# Patient Record
Sex: Male | Born: 1975 | Hispanic: No | Marital: Married | Smoking: Former smoker
Health system: Southern US, Community
[De-identification: ages and names within clinical notes are randomized; demographics above are authoritative.]

## PROBLEM LIST (undated history)

## (undated) DIAGNOSIS — I10 Essential (primary) hypertension: Secondary | ICD-10-CM

## (undated) DIAGNOSIS — E785 Hyperlipidemia, unspecified: Secondary | ICD-10-CM

## (undated) DIAGNOSIS — E119 Type 2 diabetes mellitus without complications: Secondary | ICD-10-CM

## (undated) DIAGNOSIS — N289 Disorder of kidney and ureter, unspecified: Secondary | ICD-10-CM

---

## 2005-07-23 ENCOUNTER — Ambulatory Visit (HOSPITAL_BASED_OUTPATIENT_CLINIC_OR_DEPARTMENT_OTHER): Admission: RE | Admit: 2005-07-23 | Discharge: 2005-07-23 | Payer: Self-pay | Admitting: Urology

## 2015-04-15 ENCOUNTER — Emergency Department (HOSPITAL_COMMUNITY)
Admission: EM | Admit: 2015-04-15 | Discharge: 2015-04-15 | Disposition: A | Discharge status: Home / Self Care | Payer: BLUE CROSS/BLUE SHIELD

## 2015-06-25 DIAGNOSIS — R7309 Other abnormal glucose: Secondary | ICD-10-CM

## 2015-06-25 DIAGNOSIS — E1122 Type 2 diabetes mellitus with diabetic chronic kidney disease: Secondary | ICD-10-CM

## 2015-06-25 DIAGNOSIS — R7303 Prediabetes: Secondary | ICD-10-CM

## 2015-06-25 DIAGNOSIS — R9431 Abnormal electrocardiogram [ECG] [EKG]: Secondary | ICD-10-CM

## 2015-06-25 DIAGNOSIS — R5381 Other malaise: Secondary | ICD-10-CM

## 2015-06-25 DIAGNOSIS — I10 Essential (primary) hypertension: Secondary | ICD-10-CM

## 2015-06-25 DIAGNOSIS — N1831 Chronic kidney disease, stage 3a: Secondary | ICD-10-CM

## 2015-06-25 DIAGNOSIS — E782 Mixed hyperlipidemia: Secondary | ICD-10-CM

## 2015-06-25 HISTORY — DX: Essential (primary) hypertension: I10

## 2015-06-25 HISTORY — DX: Prediabetes: R73.03

## 2015-06-25 HISTORY — DX: Chronic kidney disease, stage 3a: N18.31

## 2015-06-25 HISTORY — DX: Type 2 diabetes mellitus with diabetic chronic kidney disease: E11.22

## 2015-06-25 HISTORY — DX: Other abnormal glucose: R73.09

## 2015-06-25 HISTORY — DX: Other malaise: R53.81

## 2015-06-25 HISTORY — DX: Abnormal electrocardiogram (ECG) (EKG): R94.31

## 2015-06-25 HISTORY — DX: Mixed hyperlipidemia: E78.2

## 2016-01-19 ENCOUNTER — Other Ambulatory Visit (HOSPITAL_COMMUNITY): Payer: Self-pay | Admitting: Physician Assistant

## 2016-01-19 DIAGNOSIS — R1310 Dysphagia, unspecified: Secondary | ICD-10-CM

## 2016-01-19 HISTORY — DX: Dysphagia, unspecified: R13.10

## 2016-01-23 ENCOUNTER — Ambulatory Visit (HOSPITAL_COMMUNITY): Payer: BLUE CROSS/BLUE SHIELD

## 2016-01-27 ENCOUNTER — Ambulatory Visit (HOSPITAL_COMMUNITY): Payer: BLUE CROSS/BLUE SHIELD

## 2016-02-06 ENCOUNTER — Ambulatory Visit (HOSPITAL_COMMUNITY)
Admission: RE | Admit: 2016-02-06 | Discharge: 2016-02-06 | Disposition: A | Discharge status: Home / Self Care | Payer: BLUE CROSS/BLUE SHIELD | Source: Ambulatory Visit | Attending: Physician Assistant | Admitting: Physician Assistant

## 2016-02-06 DIAGNOSIS — K222 Esophageal obstruction: Secondary | ICD-10-CM | POA: Diagnosis not present

## 2016-02-06 DIAGNOSIS — R1310 Dysphagia, unspecified: Secondary | ICD-10-CM | POA: Insufficient documentation

## 2016-02-06 DIAGNOSIS — K449 Diaphragmatic hernia without obstruction or gangrene: Secondary | ICD-10-CM | POA: Diagnosis not present

## 2016-04-23 DIAGNOSIS — D751 Secondary polycythemia: Secondary | ICD-10-CM

## 2016-04-23 DIAGNOSIS — N183 Chronic kidney disease, stage 3 unspecified: Secondary | ICD-10-CM

## 2016-04-23 HISTORY — DX: Secondary polycythemia: D75.1

## 2016-04-23 HISTORY — DX: Chronic kidney disease, stage 3 unspecified: N18.30

## 2016-09-22 DIAGNOSIS — G4730 Sleep apnea, unspecified: Secondary | ICD-10-CM

## 2016-09-22 HISTORY — DX: Sleep apnea, unspecified: G47.30

## 2019-02-12 DIAGNOSIS — K219 Gastro-esophageal reflux disease without esophagitis: Secondary | ICD-10-CM

## 2019-02-12 HISTORY — DX: Gastro-esophageal reflux disease without esophagitis: K21.9

## 2019-07-05 ENCOUNTER — Ambulatory Visit (INDEPENDENT_AMBULATORY_CARE_PROVIDER_SITE_OTHER): Payer: BC Managed Care – PPO

## 2019-07-05 ENCOUNTER — Encounter: Payer: Self-pay | Admitting: Sports Medicine

## 2019-07-05 ENCOUNTER — Ambulatory Visit: Payer: BC Managed Care – PPO | Admitting: Sports Medicine

## 2019-07-05 ENCOUNTER — Other Ambulatory Visit: Payer: Self-pay

## 2019-07-05 ENCOUNTER — Other Ambulatory Visit: Payer: Self-pay | Admitting: Sports Medicine

## 2019-07-05 DIAGNOSIS — M79672 Pain in left foot: Secondary | ICD-10-CM

## 2019-07-05 DIAGNOSIS — M722 Plantar fascial fibromatosis: Secondary | ICD-10-CM

## 2019-07-05 DIAGNOSIS — M216X2 Other acquired deformities of left foot: Secondary | ICD-10-CM

## 2019-07-05 DIAGNOSIS — M79673 Pain in unspecified foot: Secondary | ICD-10-CM

## 2019-07-05 MED ORDER — MELOXICAM 15 MG PO TABS: 15.0000 mg | ORAL_TABLET | Freq: Every day | ORAL | 0 refills | Status: DC

## 2019-07-05 MED ORDER — PREDNISONE 10 MG (21) PO TBPK: ORAL_TABLET | ORAL | 0 refills | Status: DC
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
## 2019-07-05 NOTE — Patient Instructions (Signed)

## 2019-07-25 ENCOUNTER — Ambulatory Visit: Payer: BC Managed Care – PPO | Admitting: Sports Medicine

## 2019-09-04 DIAGNOSIS — R1084 Generalized abdominal pain: Secondary | ICD-10-CM

## 2019-09-04 DIAGNOSIS — F17200 Nicotine dependence, unspecified, uncomplicated: Secondary | ICD-10-CM

## 2019-09-04 HISTORY — DX: Generalized abdominal pain: R10.84

## 2019-09-04 HISTORY — DX: Nicotine dependence, unspecified, uncomplicated: F17.200

## 2019-09-06 DIAGNOSIS — E0590 Thyrotoxicosis, unspecified without thyrotoxic crisis or storm: Secondary | ICD-10-CM

## 2019-09-06 HISTORY — DX: Thyrotoxicosis, unspecified without thyrotoxic crisis or storm: E05.90

## 2019-09-11 ENCOUNTER — Other Ambulatory Visit: Payer: Self-pay | Admitting: Sports Medicine

## 2019-09-11 DIAGNOSIS — M722 Plantar fascial fibromatosis: Secondary | ICD-10-CM

## 2020-06-24 ENCOUNTER — Emergency Department (HOSPITAL_BASED_OUTPATIENT_CLINIC_OR_DEPARTMENT_OTHER)
Admission: EM | Admit: 2020-06-24 | Discharge: 2020-06-24 | Disposition: A | Discharge status: Home / Self Care | Payer: BC Managed Care – PPO | Attending: Emergency Medicine | Admitting: Emergency Medicine

## 2020-06-24 ENCOUNTER — Encounter (HOSPITAL_BASED_OUTPATIENT_CLINIC_OR_DEPARTMENT_OTHER): Payer: Self-pay | Admitting: Emergency Medicine

## 2020-06-24 ENCOUNTER — Other Ambulatory Visit: Payer: Self-pay

## 2020-06-24 DIAGNOSIS — Z79899 Other long term (current) drug therapy: Secondary | ICD-10-CM | POA: Diagnosis not present

## 2020-06-24 DIAGNOSIS — R55 Syncope and collapse: Secondary | ICD-10-CM | POA: Diagnosis present

## 2020-06-24 DIAGNOSIS — R112 Nausea with vomiting, unspecified: Secondary | ICD-10-CM | POA: Insufficient documentation

## 2020-06-24 DIAGNOSIS — R61 Generalized hyperhidrosis: Secondary | ICD-10-CM | POA: Insufficient documentation

## 2020-06-24 DIAGNOSIS — N183 Chronic kidney disease, stage 3 unspecified: Secondary | ICD-10-CM | POA: Diagnosis not present

## 2020-06-24 DIAGNOSIS — I129 Hypertensive chronic kidney disease with stage 1 through stage 4 chronic kidney disease, or unspecified chronic kidney disease: Secondary | ICD-10-CM | POA: Insufficient documentation

## 2020-06-24 DIAGNOSIS — D72829 Elevated white blood cell count, unspecified: Secondary | ICD-10-CM | POA: Diagnosis not present

## 2020-06-24 DIAGNOSIS — Z87891 Personal history of nicotine dependence: Secondary | ICD-10-CM | POA: Insufficient documentation

## 2020-06-24 DIAGNOSIS — E1122 Type 2 diabetes mellitus with diabetic chronic kidney disease: Secondary | ICD-10-CM | POA: Insufficient documentation

## 2020-06-24 HISTORY — DX: Essential (primary) hypertension: I10

## 2020-06-24 HISTORY — DX: Disorder of kidney and ureter, unspecified: N28.9

## 2020-06-24 HISTORY — DX: Hyperlipidemia, unspecified: E78.5

## 2020-06-24 HISTORY — DX: Type 2 diabetes mellitus without complications: E11.9

## 2020-06-24 LAB — COMPREHENSIVE METABOLIC PANEL
ALT: 43 U/L (ref 0–44)
AST: 22 U/L (ref 15–41)
Albumin: 4.3 g/dL (ref 3.5–5.0)
Alkaline Phosphatase: 45 U/L (ref 38–126)
Anion gap: 8 (ref 5–15)
BUN: 18 mg/dL (ref 6–20)
CO2: 27 mmol/L (ref 22–32)
Calcium: 9.5 mg/dL (ref 8.9–10.3)
Chloride: 100 mmol/L (ref 98–111)
Creatinine, Ser: 1.51 mg/dL — ABNORMAL HIGH (ref 0.61–1.24)
GFR, Estimated: 59 mL/min — ABNORMAL LOW (ref >60)
Glucose, Bld: 171 mg/dL — ABNORMAL HIGH (ref 70–99)
Potassium: 4.8 mmol/L (ref 3.5–5.1)
Sodium: 135 mmol/L (ref 135–145)
Total Bilirubin: 0.5 mg/dL (ref 0.3–1.2)
Total Protein: 7.1 g/dL (ref 6.5–8.1)

## 2020-06-24 LAB — LIPASE, BLOOD: Lipase: 35 U/L (ref 11–51)

## 2020-06-24 LAB — CBC
HCT: 47.1 % (ref 39.0–52.0)
Hemoglobin: 16.1 g/dL (ref 13.0–17.0)
MCH: 29 pg (ref 26.0–34.0)
MCHC: 34.2 g/dL (ref 30.0–36.0)
MCV: 84.7 fL (ref 80.0–100.0)
Platelets: 217 10*3/uL (ref 150–400)
RBC: 5.56 MIL/uL (ref 4.22–5.81)
RDW: 13.2 % (ref 11.5–15.5)
WBC: 11.5 10*3/uL — ABNORMAL HIGH (ref 4.0–10.5)
nRBC: 0 % (ref 0.0–0.2)

## 2020-06-24 LAB — CBG MONITORING, ED: Glucose-Capillary: 163 mg/dL — ABNORMAL HIGH (ref 70–99)

## 2020-06-24 MED ORDER — PROMETHAZINE HCL 25 MG/ML IJ SOLN
INTRAMUSCULAR | Status: AC
  Filled 2020-06-24: qty 1

## 2020-06-24 MED ORDER — ONDANSETRON 4 MG PO TBDP: 4.0000 mg | ORAL_TABLET | Freq: Three times a day (TID) | ORAL | 1 refills | Status: DC | PRN

## 2020-06-24 MED ORDER — SODIUM CHLORIDE 0.9 % IV BOLUS
1000.0000 mL | Freq: Once | INTRAVENOUS | Status: AC
  Administered 2020-06-24: 1000 mL via INTRAVENOUS

## 2020-06-24 MED ORDER — DEXTROSE-NACL 5-0.9 % IV SOLN: INTRAVENOUS | Status: DC

## 2020-06-24 MED ORDER — SODIUM CHLORIDE 0.9 % IV SOLN
12.5000 mg | Freq: Four times a day (QID) | INTRAVENOUS | Status: DC | PRN
  Administered 2020-06-24: 12.5 mg via INTRAVENOUS
  Filled 2020-06-24: qty 0.5

## 2020-06-24 MED ORDER — METOCLOPRAMIDE HCL 5 MG/ML IJ SOLN
10.0000 mg | Freq: Once | INTRAMUSCULAR | Status: AC
  Administered 2020-06-24: 10 mg via INTRAVENOUS
  Filled 2020-06-24: qty 2

## 2020-06-24 NOTE — ED Notes (Signed)
Pt. ambulated per MD request, pt. tolerated well, RN/MD made aware.

## 2020-06-24 NOTE — Discharge Instructions (Addendum)
Work note provided to be out of work for the next 2 days.  Take Zofran as needed.  Return for any new or worse symptoms to include passing out.  If diarrhea starts she can take Imodium A-D over-the-counter.  If this does start it would be more suggestive of viral stomach bug.

## 2020-06-24 NOTE — ED Triage Notes (Signed)
Patient reports to the ER via EMS: Patient reportedly was getting his hair cut and had a syncopal event. Patient received en route as well as 4mg  of zofran. Patient reports nausea and Emesis. Patient had positive orthostatic changes prior to fluid   SPO2 97% CBG 189 BP 126/65

## 2020-06-24 NOTE — ED Provider Notes (Addendum)
[facility] EMERGENCY DEPT Provider Note   CSN: [id number] Arrival date & time: 06/24/20  1408     History Chief Complaint  Patient presents with  . Loss of Consciousness  . Nausea  . Emesis    Kristopher Ramsey is a 45 y.o. male.  Patient brought in by EMS.  Patient was getting his haircut.  While he was sitting in the chair he started to get diaphoretic feel nauseated.  Patient then passed out.  When he woke up he vomited.  EMS gave him a liter of fluid and 4 mg of Zofran in route.  Patient states he felt fine earlier today.  Patient denies any chest pain or any shortness of breath.  EMS reported positive orthostatic changes prior to the fluid.  Blood sugar was 189.  Blood pressure reported at 126/65.  And oxygen saturation was 97%.  Patient denies any diarrhea.  Denies any sick exposures.  Also no abdominal pain.  Past medical history significant for hyperlipidemia and hypertension.  Questionable chronic kidney disease reported in the past.        Past Medical History:  Diagnosis Date  . Diabetes mellitus without complication (HCC)    prediabetic  . Hyperlipidemia   . Hypertension   . Renal disorder     Patient Active Problem List   Diagnosis Date Noted  . GERD without esophagitis 02/12/2019  . Sleep apnea 09/22/2016  . CKD (chronic kidney disease) stage 3, GFR 30-59 ml/min (HCC) 04/23/2016  . Polycythemia 04/23/2016  . Dysphagia 01/19/2016  . Abnormal blood sugar 06/25/2015  . Abnormal EKG 06/25/2015  . Essential hypertension 06/25/2015  . Malaise and fatigue 06/25/2015  . Mixed hyperlipidemia 06/25/2015  . Prediabetes 06/25/2015    Past Surgical History:  Procedure Laterality Date  . APPENDECTOMY    . HERNIA REPAIR         History reviewed. No pertinent family history.  Social History   Tobacco Use  . Smoking status: Former Games developer  . Smokeless tobacco: Never Used  Vaping Use  . Vaping Use: Every day  Substance Use Topics  . Alcohol use:  Yes    Comment: very rarely  . Drug use: Not Currently    Home Medications Prior to Admission medications   Medication Sig Start Date End Date Taking? Authorizing Provider  amLODipine (NORVASC) 5 MG tablet Take 5 mg by mouth daily. 06/24/19   [provider]  losartan-hydrochlorothiazide (HYZAAR) 50-12.5 MG tablet Take 1 tablet by mouth daily. 04/27/19   [provider]  meloxicam (MOBIC) 15 MG tablet Take 1 tablet (15 mg total) by mouth daily. 07/05/19   Asencion Islam, DPM  pravastatin (PRAVACHOL) 20 MG tablet Take 20 mg by mouth daily. 06/06/19   [provider]  predniSONE (STERAPRED UNI-PAK 21 TAB) 10 MG (21) TBPK tablet Take as directed 07/05/19   Asencion Islam, DPM    Allergies    Codeine  Review of Systems   Review of Systems  Constitutional: Negative for chills and fever.  HENT: Negative for congestion, rhinorrhea and sore throat.   Eyes: Negative for visual disturbance.  Respiratory: Negative for cough and shortness of breath.   Cardiovascular: Negative for chest pain and leg swelling.  Gastrointestinal: Positive for nausea and vomiting. Negative for abdominal pain and diarrhea.  Genitourinary: Negative for dysuria.  Musculoskeletal: Negative for back pain and neck pain.  Skin: Negative for rash.  Neurological: Positive for syncope. Negative for dizziness, light-headedness and headaches.  Hematological: Does not bruise/bleed easily.  Psychiatric/Behavioral: Negative for confusion.    Physical Exam Updated Vital Signs BP 120/69 (BP Location: Right Arm)   Pulse (!) 56   Temp 97.8 F (36.6 C) (Oral)   Resp 10   Ht 1.829 m (6')   Wt 108.9 kg   SpO2 99%   BMI 32.55 kg/m   Physical Exam Vitals and nursing note reviewed.  Constitutional:      Appearance: He is well-developed. He is ill-appearing.  HENT:     Head: Normocephalic and atraumatic.  Eyes:     Extraocular Movements: Extraocular movements intact.     Conjunctiva/sclera:  Conjunctivae normal.     Pupils: Pupils are equal, round, and reactive to light.  Cardiovascular:     Rate and Rhythm: Normal rate and regular rhythm.     Heart sounds: No murmur heard.   Pulmonary:     Effort: Pulmonary effort is normal. No respiratory distress.     Breath sounds: Normal breath sounds.  Abdominal:     Palpations: Abdomen is soft.     Tenderness: There is no abdominal tenderness.  Musculoskeletal:        General: Normal range of motion.     Cervical back: Neck supple. No rigidity.  Skin:    General: Skin is warm and dry.     Capillary Refill: Capillary refill takes less than 2 seconds.  Neurological:     General: No focal deficit present.     Mental Status: He is alert and oriented to person, place, and time.     Cranial Nerves: No cranial nerve deficit.     Sensory: No sensory deficit.     ED Results / Procedures / Treatments   Labs (all labs ordered are listed, but only abnormal results are displayed) Labs Reviewed  COMPREHENSIVE METABOLIC PANEL - Abnormal; Notable for the following components:      Result Value   Glucose, Bld 171 (*)    Creatinine, Ser 1.51 (*)    GFR, Estimated 59 (*)    All other components within normal limits  CBC - Abnormal; Notable for the following components:   WBC 11.5 (*)    All other components within normal limits  CBG MONITORING, ED - Abnormal; Notable for the following components:   Glucose-Capillary 163 (*)    All other components within normal limits  LIPASE, BLOOD  URINALYSIS, ROUTINE W REFLEX MICROSCOPIC    EKG EKG Interpretation  Date/Time:  Tuesday Jun 24 2020 14:16:07 EDT Ventricular Rate:  63 PR Interval:  134 QRS Duration: 108 QT Interval:  419 QTC Calculation: 429 R Axis:   56 Text Interpretation: Sinus rhythm Incomplete left bundle branch block ST elev, probable normal early repol pattern No previous ECGs available Confirmed by Vanetta Mulders [id number]) on 06/24/2020 2:34:36 PM   Radiology No  results found.  Procedures Procedures  Medications Ordered in ED Medications  sodium chloride 0.9 % bolus 1,000 mL (has no administration in time range)  dextrose 5 %-0.9 % sodium chloride infusion (has no administration in time range)  promethazine (PHENERGAN) 12.5 mg in sodium chloride 0.9 % 50 mL IVPB (has no administration in time range)    ED Course  I have reviewed the triage vital signs and the nursing notes.  Pertinent labs & imaging results that were available during my care of the patient were reviewed by me and considered in my medical decision making (see chart for details).    MDM Rules/Calculators/A&P  Patient will receive another liter of fluid.  Then he will be given D5 normal saline.  However his blood sugars are actually a little bit elevated here today 163.  BUN is normal.  Creatinine is 1.51.  For a GFR of 59.  So has some renal insufficiency liver function test are normal.  Lipase is normal.  CBC with a slight leukocytosis at 11.5.  Hemoglobin at 16.1.  Patient could be a little dehydrated.  EKG nothing for comparison but showed incomplete left bundle branch block.  And some ST elevation more consistent with early repolarization.  Patient has had no chest pain.  Cardiac monitoring without any arrhythmias  Will that patient's syncope was probably vasovagal.  Nausea and vomiting patient thinks is related to not being able to eat says this is happened before.  But could also be related to a viral gastritis.  Certainly if he develops any diarrhea be most consistent with a viral gastroenteritis.   Final Clinical Impression(s) / ED Diagnoses Final diagnoses:  Vasovagal syncope  Nausea and vomiting, intractability of vomiting not specified, unspecified vomiting type    Rx / DC Orders ED Discharge Orders    None       Vanetta Mulders, MD 06/24/20 1523    Vanetta Mulders, MD 06/24/20 1524

## 2020-06-24 NOTE — ED Provider Notes (Signed)
Patient feeling well on reevaluation.  He is standing and walking without difficulty.  Nausea has subsided   Gwyneth Sprout, MD 06/24/20 1711

## 2020-07-07 ENCOUNTER — Other Ambulatory Visit: Payer: Self-pay

## 2020-07-07 ENCOUNTER — Emergency Department (HOSPITAL_COMMUNITY): Payer: BC Managed Care – PPO

## 2020-07-07 ENCOUNTER — Emergency Department (HOSPITAL_COMMUNITY)
Admission: EM | Admit: 2020-07-07 | Discharge: 2020-07-08 | Disposition: A | Discharge status: Home / Self Care | Payer: BC Managed Care – PPO | Attending: Emergency Medicine | Admitting: Emergency Medicine

## 2020-07-07 ENCOUNTER — Encounter (HOSPITAL_COMMUNITY): Payer: Self-pay | Admitting: Emergency Medicine

## 2020-07-07 DIAGNOSIS — Z87891 Personal history of nicotine dependence: Secondary | ICD-10-CM | POA: Insufficient documentation

## 2020-07-07 DIAGNOSIS — I129 Hypertensive chronic kidney disease with stage 1 through stage 4 chronic kidney disease, or unspecified chronic kidney disease: Secondary | ICD-10-CM | POA: Insufficient documentation

## 2020-07-07 DIAGNOSIS — R079 Chest pain, unspecified: Secondary | ICD-10-CM | POA: Diagnosis not present

## 2020-07-07 DIAGNOSIS — Z79899 Other long term (current) drug therapy: Secondary | ICD-10-CM | POA: Diagnosis not present

## 2020-07-07 DIAGNOSIS — R55 Syncope and collapse: Secondary | ICD-10-CM | POA: Insufficient documentation

## 2020-07-07 DIAGNOSIS — N183 Chronic kidney disease, stage 3 unspecified: Secondary | ICD-10-CM | POA: Diagnosis not present

## 2020-07-07 DIAGNOSIS — E1122 Type 2 diabetes mellitus with diabetic chronic kidney disease: Secondary | ICD-10-CM | POA: Diagnosis not present

## 2020-07-07 DIAGNOSIS — R42 Dizziness and giddiness: Secondary | ICD-10-CM | POA: Diagnosis present

## 2020-07-07 LAB — CBC
HCT: 49.7 % (ref 39.0–52.0)
Hemoglobin: 17.4 g/dL — ABNORMAL HIGH (ref 13.0–17.0)
MCH: 29.6 pg (ref 26.0–34.0)
MCHC: 35 g/dL (ref 30.0–36.0)
MCV: 84.5 fL (ref 80.0–100.0)
Platelets: 281 10*3/uL (ref 150–400)
RBC: 5.88 MIL/uL — ABNORMAL HIGH (ref 4.22–5.81)
RDW: 13 % (ref 11.5–15.5)
WBC: 12.7 10*3/uL — ABNORMAL HIGH (ref 4.0–10.5)
nRBC: 0 % (ref 0.0–0.2)

## 2020-07-07 NOTE — ED Triage Notes (Signed)
Per ems, pt from home. Pt reports left arm and chest pain, dizziness, and nausea since 4pm. Pt reports tingling radiating down left arm. EMS BP 160/90, HR 70, CBG 152. 96% room air. Hx of htn and high cholesterol. Pt took aspirin prior to ems arrival. Reports pain in left arm and leg has subsided but still reports tingling. No facial droop noted. Pt reports similar episode 2 weeks ago, has not had follow up appointment yet.

## 2020-07-08 LAB — TROPONIN I (HIGH SENSITIVITY)
Troponin I (High Sensitivity): 9 ng/L (ref <18)
Troponin I (High Sensitivity): 8 ng/L (ref <18)

## 2020-07-08 LAB — BASIC METABOLIC PANEL
Anion gap: 8 (ref 5–15)
BUN: 16 mg/dL (ref 6–20)
CO2: 28 mmol/L (ref 22–32)
Calcium: 9.4 mg/dL (ref 8.9–10.3)
Chloride: 100 mmol/L (ref 98–111)
Creatinine, Ser: 1.48 mg/dL — ABNORMAL HIGH (ref 0.61–1.24)
GFR, Estimated: >60 mL/min (ref >60)
Glucose, Bld: 148 mg/dL — ABNORMAL HIGH (ref 70–99)
Potassium: 3.6 mmol/L (ref 3.5–5.1)
Sodium: 136 mmol/L (ref 135–145)

## 2020-07-08 NOTE — Discharge Instructions (Addendum)
I would call cardiology office this morning to try and get follow-up appointment scheduled. Make sure to rest, hydrate, and take it easy today. Can follow-up with your primary care doctor in the interim. Return here for any new/acute changes.

## 2020-07-08 NOTE — ED Provider Notes (Signed)
[facility] EMERGENCY DEPARTMENT Provider Note   CSN: [id number] Arrival date & time: 07/07/20  2258     History Chief Complaint  Patient presents with  . Chest Pain    Kristopher Ramsey is a 45 y.o. male.  The history is provided by the patient and medical records.   45 year old male with history of diabetes, hyperlipidemia, hypertension, chronic kidney disease, presenting to the ED today with chest pain.  2 weeks ago he was seen at sister facility after a syncopal event while getting his haircut.  States today around 3:00pm he was running errands with his wife when he began to feel very lightheaded, somewhat dizzy, nauseated, and like he was going to pass out again.  They pulled over and he sat very still and drank some water and symptoms somewhat improved. He never lost consciousness. He went home and had subsequent episode while sitting on the cough so he laid in the floor then developed chest pain and felt like he was giving himself a "panic attack".  He states pain dissipated over the past few hours but he still feels "off".  He states there is not any room spinning sensation, focal numbness/weakness, confusion, visual change, slurred speech, or trouble walking.  He does report some continued lightheadedness with standing however.  He did eat/drink today as normal.  No new medications or changes to his usual dosing.  No fevers or recent illness.  He denies known cardiac history.  Past Medical History:  Diagnosis Date  . Diabetes mellitus without complication (HCC)    prediabetic  . Hyperlipidemia   . Hypertension   . Renal disorder     Patient Active Problem List   Diagnosis Date Noted  . GERD without esophagitis 02/12/2019  . Sleep apnea 09/22/2016  . CKD (chronic kidney disease) stage 3, GFR 30-59 ml/min (HCC) 04/23/2016  . Polycythemia 04/23/2016  . Dysphagia 01/19/2016  . Abnormal blood sugar 06/25/2015  . Abnormal EKG 06/25/2015  . Essential hypertension  06/25/2015  . Malaise and fatigue 06/25/2015  . Mixed hyperlipidemia 06/25/2015  . Prediabetes 06/25/2015    Past Surgical History:  Procedure Laterality Date  . APPENDECTOMY    . HERNIA REPAIR         No family history on file.  Social History   Tobacco Use  . Smoking status: Former Games developer  . Smokeless tobacco: Never Used  Vaping Use  . Vaping Use: Every day  Substance Use Topics  . Alcohol use: Yes    Comment: very rarely  . Drug use: Not Currently    Home Medications Prior to Admission medications   Medication Sig Start Date End Date Taking? Authorizing Provider  amLODipine (NORVASC) 5 MG tablet Take 5 mg by mouth daily. 06/24/19   [provider]  losartan-hydrochlorothiazide (HYZAAR) 50-12.5 MG tablet Take 1 tablet by mouth daily. 04/27/19   [provider]  meloxicam (MOBIC) 15 MG tablet Take 1 tablet (15 mg total) by mouth daily. 07/05/19   Stover, Titorya, DPM  ondansetron (ZOFRAN ODT) 4 MG disintegrating tablet Take 1 tablet (4 mg total) by mouth every 8 (eight) hours as needed for nausea or vomiting. 06/24/20   Vanetta Mulders, MD  pravastatin (PRAVACHOL) 20 MG tablet Take 20 mg by mouth daily. 06/06/19   [provider]  predniSONE (STERAPRED UNI-PAK 21 TAB) 10 MG (21) TBPK tablet Take as directed 07/05/19   Asencion Islam, DPM    Allergies    Codeine  Review of Systems  Review of Systems  Cardiovascular: Positive for chest pain.  Gastrointestinal: Positive for nausea.  Neurological: Positive for light-headedness.  All other systems reviewed and are negative.   Physical Exam Updated Vital Signs BP 120/80 (BP Location: Right Arm)   Pulse 62   Temp (!) 97.5 F (36.4 C) (Oral)   Resp 16   Ht 6' (1.829 m)   Wt 108.9 kg   SpO2 100%   BMI 32.55 kg/m   Physical Exam Vitals and nursing note reviewed.  Constitutional:      Appearance: He is well-developed.  HENT:     Head: Normocephalic and atraumatic.  Eyes:      Conjunctiva/sclera: Conjunctivae normal.     Pupils: Pupils are equal, round, and reactive to light.  Cardiovascular:     Rate and Rhythm: Normal rate and regular rhythm.     Heart sounds: Normal heart sounds.  Pulmonary:     Effort: Pulmonary effort is normal.     Breath sounds: Normal breath sounds. No wheezing or rhonchi.  Abdominal:     General: Bowel sounds are normal.     Palpations: Abdomen is soft.  Musculoskeletal:        General: Normal range of motion.     Cervical back: Normal range of motion.  Skin:    General: Skin is warm and dry.  Neurological:     Mental Status: He is alert and oriented to person, place, and time.     Comments: AAOx3, answering questions and following commands appropriately; equal strength UE and LE bilaterally; CN grossly intact; moves all extremities appropriately without ataxia; no focal neuro deficits or facial asymmetry appreciated, speech is clear and goal oriented     ED Results / Procedures / Treatments   Labs (all labs ordered are listed, but only abnormal results are displayed) Labs Reviewed  BASIC METABOLIC PANEL - Abnormal; Notable for the following components:      Result Value   Glucose, Bld 148 (*)    Creatinine, Ser 1.48 (*)    All other components within normal limits  CBC - Abnormal; Notable for the following components:   WBC 12.7 (*)    RBC 5.88 (*)    Hemoglobin 17.4 (*)    All other components within normal limits  TROPONIN I (HIGH SENSITIVITY)  TROPONIN I (HIGH SENSITIVITY)    EKG EKG Interpretation  Date/Time:  Monday Jul 07 2020 23:08:01 EDT Ventricular Rate:  69 PR Interval:  122 QRS Duration: 102 QT Interval:  386 QTC Calculation: 413 R Axis:   46 Text Interpretation: Normal sinus rhythm Normal ECG Confirmed by Gilda Crease [phone]) on 07/08/2020 5:28:42 AM   Radiology DG Chest 2 View  Result Date: 07/07/2020 CLINICAL DATA:  Chest pain for several hours EXAM: CHEST - 2 VIEW COMPARISON:   02/12/18 FINDINGS: Cardiac shadow is within normal limits. The lungs are well aerated bilaterally. No focal infiltrate or sizable effusion is seen. No acute bony abnormality is noted. IMPRESSION: No active cardiopulmonary disease. Electronically Signed   By: Alcide Clever M.D.   On: 07/07/2020 23:35    Procedures Procedures   Medications Ordered in ED Medications - No data to display  ED Course  I have reviewed the triage vital signs and the nursing notes.  Pertinent labs & imaging results that were available during my care of the patient were reviewed by me and considered in my medical decision making (see chart for details).    MDM Rules/Calculators/A&P  45 year old male  presenting to the ED with near syncopal event x2 today.  He never lost consciousness today.  He did have full syncopal event 2 weeks ago and was evaluated at sister facility with negative work-up.  States he is not sure why all of a sudden he is having these problems.  He does report some lightheadedness with standing, but other episode today occurred while sitting at rest.  Also had new chest pain today which has resolved prior to evaluation.  No fever or other infectious symptoms.  He is awake, alert, appropriately oriented here.  He has no focal neurologic deficits.  He is hemodynamically stable.  His EKG is nonischemic.  Labs are reassuring, serum creatinine appears around baseline.  Troponins are negative x2.  He does not have any noted tachycardia or hypoxia.  He has no noted risk factors for PE and is PERC negative.  Will check orthostatics.  6:12 AM Orthostatics are appropriate.  He does report some continued transient lightheadedness when standing but this improves after a few seconds.  No room spinning sensations or gait disturbance.  He did not have any recurrent feelings of syncope with standing or moving about in room.  He is feeling well at present without any chest pain and vitals remain stable.  Unclear etiology  of his symptoms.  Given his recurrent near syncopal events today, there is consideration for possible underlying arrhythmia or similar.  Given his reassuring work-up and stable vitals, I have lower suspicion for ACS, PE, dissection, or other acute cardiac event.  I feel he is stable for discharge home but will refer to cardiology for possible Holter monitoring/Zio patch.  Wife will call this morning to get this scheduled.  Encouraged to rest and hydrate well today, moving slowly to prevent any syncopal events.  Return here for any new or acute changes.  Final Clinical Impression(s) / ED Diagnoses Final diagnoses:  Chest pain, unspecified type  Near syncope    Rx / DC Orders ED Discharge Orders    None       Garlon Hatchet, PA-C 07/08/20 0622    Gilda Crease, MD 07/08/20 [phone]

## 2020-07-10 DIAGNOSIS — E785 Hyperlipidemia, unspecified: Secondary | ICD-10-CM | POA: Insufficient documentation

## 2020-07-10 DIAGNOSIS — E119 Type 2 diabetes mellitus without complications: Secondary | ICD-10-CM | POA: Insufficient documentation

## 2020-07-10 DIAGNOSIS — N289 Disorder of kidney and ureter, unspecified: Secondary | ICD-10-CM | POA: Insufficient documentation

## 2020-07-10 DIAGNOSIS — I10 Essential (primary) hypertension: Secondary | ICD-10-CM | POA: Insufficient documentation

## 2020-07-11 ENCOUNTER — Encounter: Payer: Self-pay | Admitting: Cardiology

## 2020-07-11 ENCOUNTER — Ambulatory Visit (INDEPENDENT_AMBULATORY_CARE_PROVIDER_SITE_OTHER): Payer: BC Managed Care – PPO

## 2020-07-11 ENCOUNTER — Other Ambulatory Visit: Payer: Self-pay

## 2020-07-11 ENCOUNTER — Ambulatory Visit: Payer: BC Managed Care – PPO | Admitting: Cardiology

## 2020-07-11 VITALS — BP 135/93 | HR 74 | Ht 72.0 in | Wt 235.2 lb

## 2020-07-11 DIAGNOSIS — R55 Syncope and collapse: Secondary | ICD-10-CM

## 2020-07-11 DIAGNOSIS — I10 Essential (primary) hypertension: Secondary | ICD-10-CM

## 2020-07-11 DIAGNOSIS — E1122 Type 2 diabetes mellitus with diabetic chronic kidney disease: Secondary | ICD-10-CM | POA: Diagnosis not present

## 2020-07-11 DIAGNOSIS — E119 Type 2 diabetes mellitus without complications: Secondary | ICD-10-CM

## 2020-07-11 DIAGNOSIS — R42 Dizziness and giddiness: Secondary | ICD-10-CM

## 2020-07-11 DIAGNOSIS — N1831 Chronic kidney disease, stage 3a: Secondary | ICD-10-CM

## 2020-07-11 DIAGNOSIS — E782 Mixed hyperlipidemia: Secondary | ICD-10-CM | POA: Diagnosis not present

## 2020-07-11 HISTORY — DX: Dizziness and giddiness: R42

## 2020-07-11 LAB — PROTIME-INR

## 2020-07-11 NOTE — Patient Instructions (Signed)
Medication Instructions:  No medication changes. *If you need a refill on your cardiac medications before your next appointment, please call your pharmacy*   Lab Work: None ordered If you have labs (blood work) drawn today and your tests are completely normal, you will receive your results only by: Marland Kitchen MyChart Message (if you have MyChart) OR . A paper copy in the mail If you have any lab test that is abnormal or we need to change your treatment, we will call you to review the results.   Testing/Procedures: Your physician has requested that you have a lexiscan myoview. For further information please visit [email or web address] Please follow instruction sheet, as given.  The test will take approximately 3 to 4 hours to complete; you may bring reading material.  If someone comes with you to your appointment, they will need to remain in the main lobby due to limited space in the testing area.   How to prepare for your Myocardial Perfusion Test: . Do not eat or drink 3 hours prior to your test, except you may have water. . Do not consume products containing caffeine (regular or decaffeinated) 12 hours prior to your test. (ex: coffee, chocolate, sodas, tea). . Do bring a list of your current medications with you.  If not listed below, you may take your medications as normal. . Do wear comfortable clothes (no dresses or overalls) and walking shoes, tennis shoes preferred (No heels or open toe shoes are allowed). . Do NOT wear cologne, perfume, aftershave, or lotions (deodorant is allowed). . If these instructions are not followed, your test will have to be rescheduled.  Your physician has requested that you have an echocardiogram. Echocardiography is a painless test that uses sound waves to create images of your heart. It provides your doctor with information about the size and shape of your heart and how well your heart's chambers and valves are working. This procedure takes approximately one hour.  There are no restrictions for this procedure.   WHY IS MY DOCTOR PRESCRIBING ZIO? The Zio system is proven and trusted by physicians to detect and diagnose irregular heart rhythms -- and has been prescribed to hundreds of thousands of patients.  The FDA has cleared the Zio system to monitor for many different kinds of irregular heart rhythms. In a study, physicians were able to reach a diagnosis 90% of the time with the Zio system1.  You can wear the Zio monitor -- a small, discreet, comfortable patch -- during your normal day-to-day activity, including while you sleep, shower, and exercise, while it records every single heartbeat for analysis.  1Barrett, P., et al. Comparison of 24 Hour Holter Monitoring Versus 14 Day Novel Adhesive Patch Electrocardiographic Monitoring. American Journal of Medicine, 2014.  ZIO VS. HOLTER MONITORING The Zio monitor can be comfortably worn for up to 14 days. Holter monitors can be worn for 24 to 48 hours, limiting the time to record any irregular heart rhythms you may have. Zio is able to capture data for the 51% of patients who have their first symptom-triggered arrhythmia after 48 hours.1  LIVE WITHOUT RESTRICTIONS The Zio ambulatory cardiac monitor is a small, unobtrusive, and water-resistant patch--you might even forget you're wearing it. The Zio monitor records and stores every beat of your heart, whether you're sleeping, working out, or showering. Wear the monitor for 2 weeks, remove 07/25/20.  Follow-Up: At [facility], you and your health needs are our priority.  As part of our continuing mission to provide you with  exceptional heart care, we have created designated Provider Care Teams.  These Care Teams include your primary Cardiologist (physician) and Advanced Practice Providers (APPs -  Physician Assistants and Nurse Practitioners) who all work together to provide you with the care you need, when you need it.  We recommend signing up for the patient  portal called "MyChart".  Sign up information is provided on this After Visit Summary.  MyChart is used to connect with patients for Virtual Visits (Telemedicine).  Patients are able to view lab/test results, encounter notes, upcoming appointments, etc.  Non-urgent messages can be sent to your provider as well.   To learn more about what you can do with MyChart, go to [email or web address].    Your next appointment:   2 month(s)  The format for your next appointment:   In Person  Provider:   Belva Cromeajan Revankar, MD   Other Instructions Cardiac Nuclear Scan A cardiac nuclear scan is a test that is done to check the flow of blood to your heart. It is done when you are resting and when you are exercising. The test looks for problems such as:  Not enough blood reaching a portion of the heart.  The heart muscle not working as it should. You may need this test if:  You have heart disease.  You have had lab results that are not normal.  You have had heart surgery or a balloon procedure to open up blocked arteries (angioplasty).  You have chest pain.  You have shortness of breath. In this test, a special dye (tracer) is put into your bloodstream. The tracer will travel to your heart. A camera will then take pictures of your heart to see how the tracer moves through your heart. This test is usually done at a hospital and takes 2-4 hours. Tell a doctor about:  Any allergies you have.  All medicines you are taking, including vitamins, herbs, eye drops, creams, and over-the-counter medicines.  Any problems you or family members have had with anesthetic medicines.  Any blood disorders you have.  Any surgeries you have had.  Any medical conditions you have.  Whether you are pregnant or may be pregnant. What are the risks? Generally, this is a safe test. However, problems may occur, such as:  Serious chest pain and heart attack. This is only a risk if the stress portion of the test is  done.  Rapid heartbeat.  A feeling of warmth in your chest. This feeling usually does not last long.  Allergic reaction to the tracer. What happens before the test?  Ask your doctor about changing or stopping your normal medicines. This is important.  Follow instructions from your doctor about what you cannot eat or drink.  Remove your jewelry on the day of the test. What happens during the test? 1. An IV tube will be inserted into one of your veins. 2. Your doctor will give you a small amount of tracer through the IV tube. 3. You will wait for 20-40 minutes while the tracer moves through your bloodstream. 4. Your heart will be monitored with an electrocardiogram (ECG). 5. You will lie down on an exam table. 6. Pictures of your heart will be taken for about 15-20 minutes. 7. You may also have a stress test. For this test, one of these things may be done: ? You will be asked to exercise on a treadmill or a stationary bike. ? You will be given medicines that will make your heart work harder. This  is done if you are unable to exercise. 8. When blood flow to your heart has peaked, a tracer will again be given through the IV tube. 9. After 20-40 minutes, you will get back on the exam table. More pictures will be taken of your heart. 10. Depending on the tracer that is used, more pictures may need to be taken 3-4 hours later. 11. Your IV tube will be removed when the test is over. The test may vary among doctors and hospitals. What happens after the test? 1. Ask your doctor: ? Whether you can return to your normal schedule, including diet, activities, and medicines. ? Whether you should drink more fluids. This will help to remove the tracer from your body. Drink enough fluid to keep your pee (urine) pale yellow. 2. Ask your doctor, or the department that is doing the test: ? When will my results be ready? ? How will I get my results? Summary  A cardiac nuclear scan is a test that is  done to check the flow of blood to your heart.  Tell your doctor whether you are pregnant or may be pregnant.  Before the test, ask your doctor about changing or stopping your normal medicines. This is important.  Ask your doctor whether you can return to your normal activities. You may be asked to drink more fluids. This information is not intended to replace advice given to you by your health care provider. Make sure you discuss any questions you have with your health care provider. Document Revised: 05/31/2018 Document Reviewed: 07/25/2017 Elsevier Patient Education  2021 Elsevier Inc.    Echocardiogram An echocardiogram is a test that uses sound waves (ultrasound) to produce images of the heart. Images from an echocardiogram can provide important information about:  Heart size and shape.  The size and thickness and movement of your heart's walls.  Heart muscle function and strength.  Heart valve function or if you have stenosis. Stenosis is when the heart valves are too narrow.  If blood is flowing backward through the heart valves (regurgitation).  A tumor or infectious growth around the heart valves.  Areas of heart muscle that are not working well because of poor blood flow or injury from a heart attack.  Aneurysm detection. An aneurysm is a weak or damaged part of an artery wall. The wall bulges out from the normal force of blood pumping through the body. Tell a health care provider about:  Any allergies you have.  All medicines you are taking, including vitamins, herbs, eye drops, creams, and over-the-counter medicines.  Any blood disorders you have.  Any surgeries you have had.  Any medical conditions you have.  Whether you are pregnant or may be pregnant. What are the risks? Generally, this is a safe test. However, problems may occur, including an allergic reaction to dye (contrast) that may be used during the test. What happens before the test? No specific  preparation is needed. You may eat and drink normally. What happens during the test?  You will take off your clothes from the waist up and put on a hospital gown.  Electrodes or electrocardiogram (ECG)patches may be placed on your chest. The electrodes or patches are then connected to a device that monitors your heart rate and rhythm.  You will lie down on a table for an ultrasound exam. A gel will be applied to your chest to help sound waves pass through your skin.  A handheld device, called a transducer, will be pressed  against your chest and moved over your heart. The transducer produces sound waves that travel to your heart and bounce back (or "echo" back) to the transducer. These sound waves will be captured in real-time and changed into images of your heart that can be viewed on a video monitor. The images will be recorded on a computer and reviewed by your health care provider.  You may be asked to change positions or hold your breath for a short time. This makes it easier to get different views or better views of your heart.  In some cases, you may receive contrast through an IV in one of your veins. This can improve the quality of the pictures from your heart. The procedure may vary among health care providers and hospitals.    What can I expect after the test? You may return to your normal, everyday life, including diet, activities, and medicines, unless your health care provider tells you not to do that. Follow these instructions at home:  It is up to you to get the results of your test. Ask your health care provider, or the department that is doing the test, when your results will be ready.  Keep all follow-up visits. This is important. Summary  An echocardiogram is a test that uses sound waves (ultrasound) to produce images of the heart.  Images from an echocardiogram can provide important information about the size and shape of your heart, heart muscle function, heart valve  function, and other possible heart problems.  You do not need to do anything to prepare before this test. You may eat and drink normally.  After the echocardiogram is completed, you may return to your normal, everyday life, unless your health care provider tells you not to do that. This information is not intended to replace advice given to you by your health care provider. Make sure you discuss any questions you have with your health care provider. Document Revised: 10/02/2019 Document Reviewed: 10/02/2019 Elsevier Patient Education  2021 [employer].

## 2020-07-11 NOTE — Progress Notes (Signed)
Cardiology Office Note:    Date:  07/11/2020   ID:  Kristopher Ramsey, DOB 1975-12-16, MRN [id number]  PCP:  Kristopher Payment., MD  Cardiologist:  Kristopher Brothers, MD   Referring MD: Kristopher Payment., MD    ASSESSMENT:    1. Essential hypertension   2. Mixed hyperlipidemia   3. Diabetes mellitus without complication (HCC)   4. Type 2 diabetes mellitus with stage 3a chronic kidney disease, without long-term current use of insulin (HCC)   5. Syncope and collapse    PLAN:    In order of problems listed above:  1. Primary prevention stressed with the patient.  Importance of compliance with diet medication stressed any vocalized understanding. 2. Essential hypertension: Blood pressure stable and lifestyle modification was urged and he promises to do better. 3. Cardiac murmur: Echocardiogram will be done to assess murmur heard on auscultation. 4. Chest discomfort: We will evaluate this with a Lexiscan sestamibi.  He does not have any pain at this time.  Emergency room records were reviewed. 5. In view of dizzy spells I will do a 2-week monitoring. 6. Diabetes mellitus and renal insufficiency: Managed by primary care and I questioned him with diet and lifestyle modification. 7. Mixed dyslipidemia: Again managed by primary care.  Diet emphasized. 8. At the end of the conversation the patient told us that he is feeling very dizzy and not well and we have recommended that he go to the emergency room.  He does not want to go by ambulance.  He tells me that his mother is going to drive him up to the emergency room.  Risks explained and he understands.  I respect his wishes.  I mentioned to him that in view of his significant dizziness of unclear etiology CT scan of the head might be beneficial and he will discuss this with the emergency room physician. 9. Patient will be seen in follow-up appointment in 3 months or earlier if the patient has any concerns    Medication Adjustments/Labs and Tests  Ordered: Current medicines are reviewed at length with the patient today.  Concerns regarding medicines are outlined above.  Orders Placed This Encounter  Procedures  . LONG TERM MONITOR (3-14 DAYS)  . MYOCARDIAL PERFUSION IMAGING  . EKG 12-Lead  . ECHOCARDIOGRAM COMPLETE   No orders of the defined types were placed in this encounter.    History of Present Illness:    Kristopher Ramsey is a 45 y.o. male who is being seen today for the evaluation of dizziness and chest pain at the request of Kristopher Payment., MD.  Patient is a pleasant 45 year old male.  He has past medical history of essential hypertension and mixed dyslipidemia.  He also has history of renal insufficiency.  And diabetes mellitus.  He went to the hospital a couple of times with dizziness and chest pain like symptoms.  He was evaluated and released.  Subsequently has done fine.  He has never actually passed out.  He complains of chest pain at times.  This is not related to exertion but can go to the left arm.  For this reason he was referred here.  At the time of my evaluation he is alert awake oriented and in no distress but tells me that he does not feel well.  Past Medical History:  Diagnosis Date  . Abnormal blood sugar 06/25/2015  . Abnormal EKG 06/25/2015  . CKD (chronic kidney disease) stage 3, GFR 30-59 ml/min (HCC) 04/23/2016   Formatting  of this note might be different from the original. Premature with kidney changes since birth.  . Diabetes mellitus without complication (HCC)    prediabetic  . Dysphagia 01/19/2016  . Essential hypertension 06/25/2015  . Generalized abdominal pain 09/04/2019  . GERD without esophagitis 02/12/2019  . Hyperlipidemia   . Hypertension   . Hyperthyroidism 09/06/2019   Formatting of this note might be different from the original. ? MNG. No hot nodules. Not diffusely uptake on nuclear. Ordered endocrine referral. ? Recent thyroiditis.  Toma Deiters and fatigue 06/25/2015  . Mixed hyperlipidemia  06/25/2015  . Nicotine dependence, uncomplicated 09/04/2019  . Polycythemia 04/23/2016   Formatting of this note might be different from the original. Likely a combination of male hormone issue and sleep apnea.  . Prediabetes 06/25/2015  . Renal disorder   . Sleep apnea 09/22/2016  . Type 2 diabetes mellitus with stage 3a chronic kidney disease, without long-term current use of insulin (HCC) 06/25/2015    Past Surgical History:  Procedure Laterality Date  . APPENDECTOMY    . HERNIA REPAIR      Current Medications: No outpatient medications have been marked as taking for the 07/11/20 encounter (Office Visit) with Kristopher Ramsey, Kristopher Dubin, MD.     Allergies:   Codeine   Social History   Socioeconomic History  . Marital status: Married    Spouse name: Not on file  . Number of children: Not on file  . Years of education: Not on file  . Highest education level: Not on file  Occupational History  . Not on file  Tobacco Use  . Smoking status: Former Games developer  . Smokeless tobacco: Never Used  Vaping Use  . Vaping Use: Every day  Substance and Sexual Activity  . Alcohol use: Yes    Comment: very rarely  . Drug use: Not Currently  . Sexual activity: Yes  Other Topics Concern  . Not on file  Social History Narrative  . Not on file   Social Determinants of Health   Financial Resource Strain: Not on file  Food Insecurity: Not on file  Transportation Needs: Not on file  Physical Activity: Not on file  Stress: Not on file  Social Connections: Not on file     Family History: The patient's Family history is unknown by patient.  ROS:   Please see the history of present illness.    All other systems reviewed and are negative.  EKGs/Labs/Other Studies Reviewed:    The following studies were reviewed today: EKG reveals sinus rhythm and nonspecific ST-T changes   Recent Labs: 06/24/2020: ALT 43 07/07/2020: BUN 16; Creatinine, Ser 1.48; Hemoglobin 17.4; Platelets 281; Potassium 3.6; Sodium  136  Recent Lipid Panel No results found for: CHOL, TRIG, HDL, CHOLHDL, VLDL, LDLCALC, LDLDIRECT  Physical Exam:    VS:  Ht 6' (1.829 m)   Wt 235 lb 3.2 oz (106.7 kg)   SpO2 97%   BMI 31.90 kg/m     Wt Readings from Last 3 Encounters:  07/11/20 235 lb 3.2 oz (106.7 kg)  07/07/20 240 lb (108.9 kg)  06/24/20 240 lb (108.9 kg)     GEN: Patient is in no acute distress HEENT: Normal NECK: No JVD; No carotid bruits LYMPHATICS: No lymphadenopathy CARDIAC: S1 S2 regular, 2/6 systolic murmur at the apex. RESPIRATORY:  Clear to auscultation without rales, wheezing or rhonchi  ABDOMEN: Soft, non-tender, non-distended MUSCULOSKELETAL:  No edema; No deformity  SKIN: Warm and dry NEUROLOGIC:  Alert and oriented x  3 PSYCHIATRIC:  Normal affect    Signed, Kristopher Brothers, MD  07/11/2020 4:00 PM    [facility]

## 2020-07-11 NOTE — Addendum Note (Signed)
Addended by: Eleonore Chiquito on: 07/11/2020 04:42 PM   Modules accepted: Orders

## 2020-07-11 NOTE — Progress Notes (Signed)
Spoke with Kristopher Carney RN and gave report as to why pt was coming. All records were sent via epic.  Pt to the ED via his mom. Pt declined EMS. Pt continue to c/o feeling lightheaded and like he is going to pass out. Pt taken to car via wc. BS 186.

## 2020-07-11 NOTE — Addendum Note (Signed)
Addended by: Belva Crome R on: 07/11/2020 04:43 PM   Modules accepted: Orders

## 2020-07-14 ENCOUNTER — Telehealth: Payer: Self-pay | Admitting: Cardiology

## 2020-07-14 ENCOUNTER — Telehealth (HOSPITAL_COMMUNITY): Payer: Self-pay | Admitting: *Deleted

## 2020-07-14 NOTE — Telephone Encounter (Signed)
PT WAS ADMITTED TO THE HOSPITAL ON 5/20 , PTS MOM STATES THAT THE MONITOR CAME OFF AND IS NOT STICKING BACK ON, WOULD LIKE TO KNOW IF SHE CAN HAVE ANOTHER ONE

## 2020-07-14 NOTE — Telephone Encounter (Signed)
Contacted Meredith with [employer] to have another monitor sent to pt. She will get back with me tomorrow and I will call pt then

## 2020-07-14 NOTE — Telephone Encounter (Signed)
Left message on voicemail in reference to upcoming appointment scheduled for 07/17/20. Phone number given for a call back so details instructions can be given.  Kristopher Ramsey

## 2020-07-15 ENCOUNTER — Encounter: Payer: Self-pay | Admitting: *Deleted

## 2020-07-17 NOTE — Telephone Encounter (Signed)
Spoke with pt today about monitor and he stated that he had received the new one in the mail today and I advised him to mail the other back. Pt will apply to new monitor and send back when done. Let pt know to let us know if any further problems occur with monitor. Pt verbalized understanding

## 2020-08-05 ENCOUNTER — Other Ambulatory Visit: Payer: BC Managed Care – PPO

## 2020-09-11 ENCOUNTER — Ambulatory Visit: Payer: BC Managed Care – PPO | Admitting: Cardiology

## 2022-10-07 IMAGING — CR DG CHEST 2V
2 series · 2 of 2 positions shown · non-contrast
Comparison: 02/12/18

CLINICAL DATA: Chest pain for several hours

EXAM:
CHEST - 2 VIEW

[chest lat]
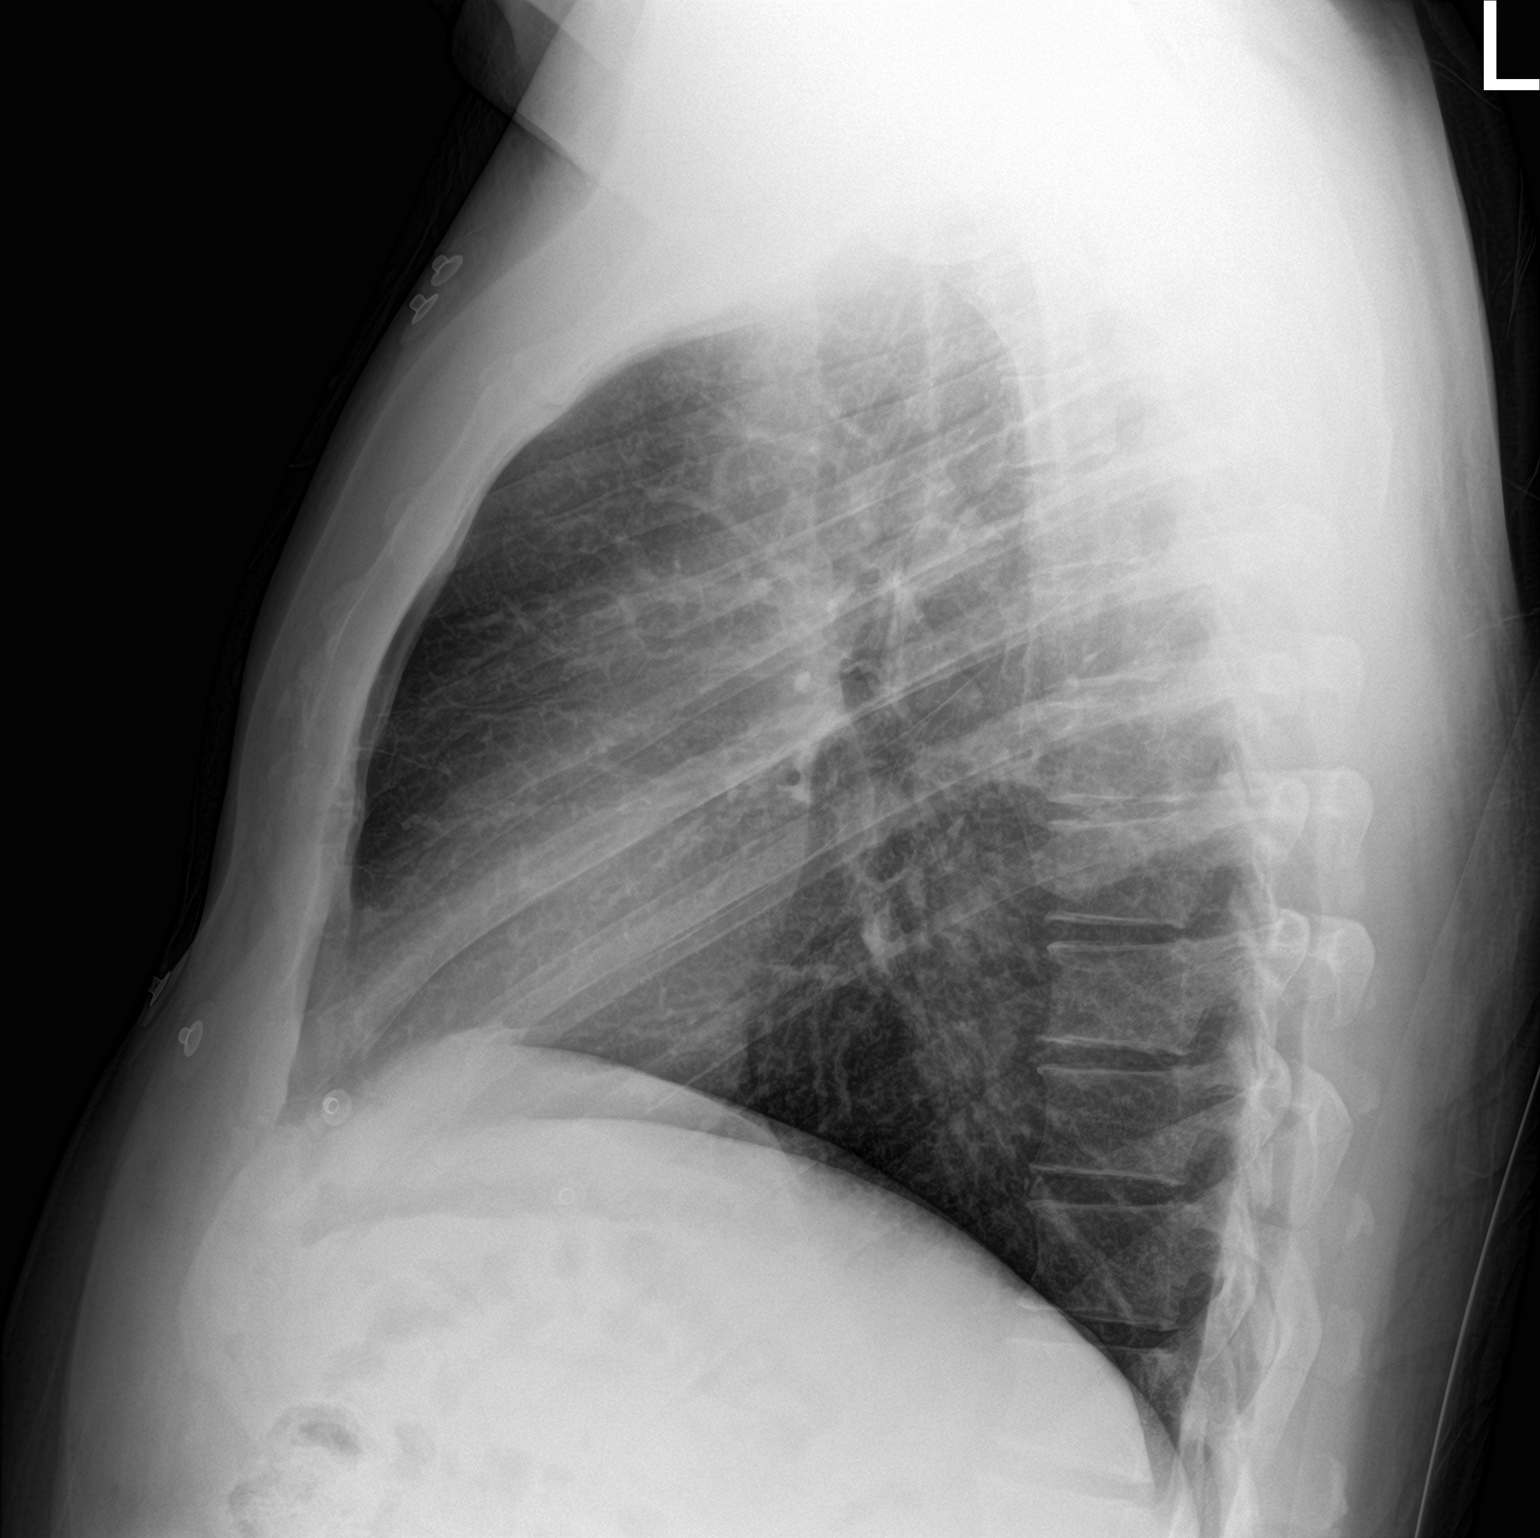

[chest ap]
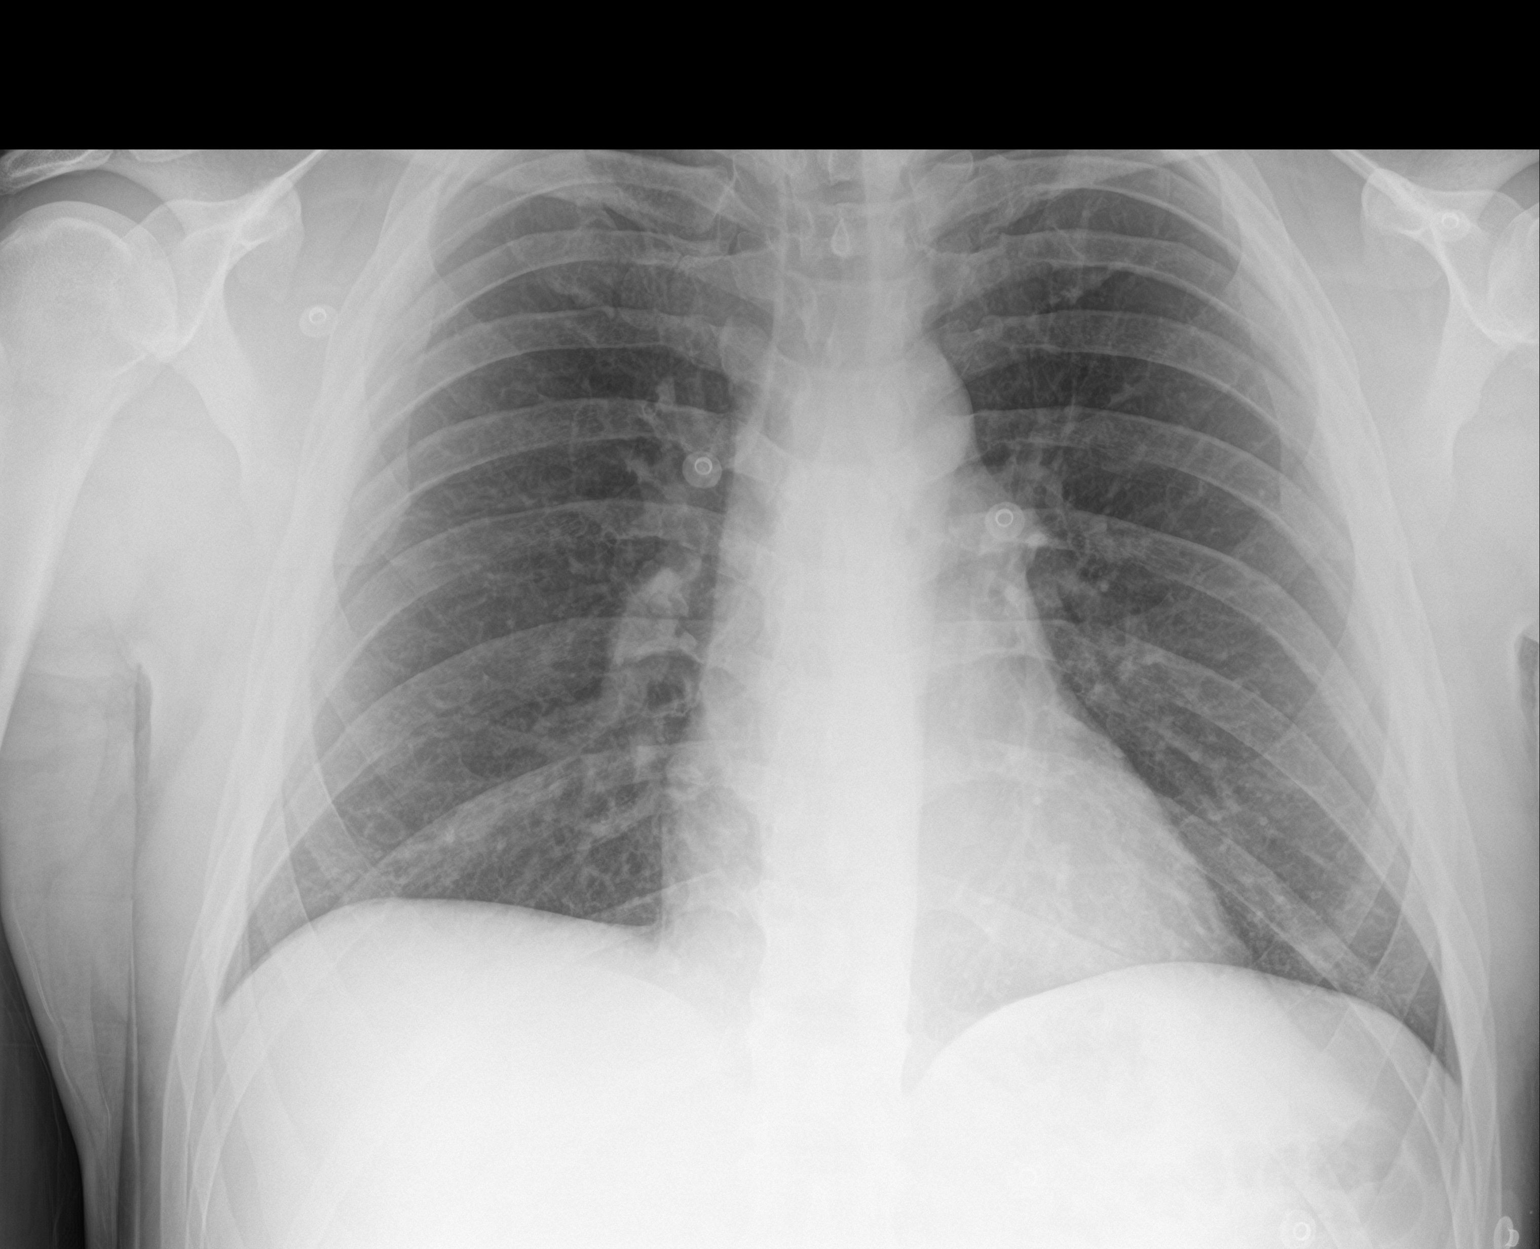

[2 of 2 positions shown; findings below may reference images not displayed]

FINDINGS: Cardiac shadow is within normal limits. The lungs are well aerated
bilaterally. No focal infiltrate or sizable effusion is seen. No
acute bony abnormality is noted.
IMPRESSION: No active cardiopulmonary disease.
